# Patient Record
Sex: Female | Born: 1980 | Race: Black or African American | Hispanic: No | Marital: Single | Smoking: Never smoker
Health system: Southern US, Community
[De-identification: ages and names within clinical notes are randomized; demographics above are authoritative.]

## PROBLEM LIST (undated history)

## (undated) DIAGNOSIS — J45909 Unspecified asthma, uncomplicated: Secondary | ICD-10-CM

---

## 2006-03-17 ENCOUNTER — Emergency Department (HOSPITAL_COMMUNITY): Admission: EM | Admit: 2006-03-17 | Discharge: 2006-03-17 | Payer: Self-pay | Admitting: *Deleted
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
## 2009-07-18 ENCOUNTER — Emergency Department (HOSPITAL_COMMUNITY): Admission: EM | Admit: 2009-07-18 | Discharge: 2009-07-18 | Payer: Self-pay | Admitting: Family Medicine

## 2022-12-27 ENCOUNTER — Emergency Department (HOSPITAL_BASED_OUTPATIENT_CLINIC_OR_DEPARTMENT_OTHER)
Admission: EM | Admit: 2022-12-27 | Discharge: 2022-12-27 | Disposition: A | Discharge status: Home / Self Care | Payer: BC Managed Care – PPO | Attending: Emergency Medicine | Admitting: Emergency Medicine

## 2022-12-27 ENCOUNTER — Encounter (HOSPITAL_BASED_OUTPATIENT_CLINIC_OR_DEPARTMENT_OTHER): Payer: Self-pay | Admitting: Emergency Medicine

## 2022-12-27 ENCOUNTER — Other Ambulatory Visit: Payer: Self-pay

## 2022-12-27 DIAGNOSIS — M542 Cervicalgia: Secondary | ICD-10-CM | POA: Insufficient documentation

## 2022-12-27 DIAGNOSIS — Z91040 Latex allergy status: Secondary | ICD-10-CM | POA: Diagnosis not present

## 2022-12-27 DIAGNOSIS — Y92410 Unspecified street and highway as the place of occurrence of the external cause: Secondary | ICD-10-CM | POA: Diagnosis not present

## 2022-12-27 HISTORY — DX: Unspecified asthma, uncomplicated: J45.909

## 2022-12-27 MED ORDER — METHOCARBAMOL 500 MG PO TABS: 500.0000 mg | ORAL_TABLET | Freq: Two times a day (BID) | ORAL | 0 refills | Status: AC
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
## 2022-12-27 NOTE — ED Notes (Signed)
Reviewed AVS/discharge instruction with patient. Time allotted for and all questions answered. Patient is agreeable for d/c and escorted to ed exit by staff.  
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
## 2024-03-29 ENCOUNTER — Encounter: Payer: Self-pay | Admitting: *Deleted

## 2024-03-29 NOTE — Congregational Nurse Program (Signed)
  Dept: [phone]   Congregational Nurse Program Note  Date of Encounter: 03/29/2024  Past Medical History: Past Medical History:  Diagnosis Date   Asthma     Encounter Details:  Community Questionnaire - 03/29/24 1629       Questionnaire   Ask client: Do you give verbal consent for me to treat you today? Yes    Student Assistance N/A    Location Patient Served  [employer] [place]    Encounter Setting Other    Population Status Unknown    Insurance Unknown    Insurance/Financial Assistance Referral N/A    Medication N/A    Medical Provider No    Screening Referrals Made N/A    Medical Referrals Made N/A    Medical Appointment Completed N/A    CNP Interventions N/A    Screenings CN Performed N/A    ED Visit Averted N/A    Life-Saving Intervention Made N/A          Community event blood pressure checks. Tabitha Riggins M
